# Patient Record
Sex: Female | Born: 1980 | Race: Black or African American | Hispanic: No | Marital: Single | Smoking: Never smoker
Health system: Southern US, Community
[De-identification: ages and names within clinical notes are randomized; demographics above are authoritative.]

## PROBLEM LIST (undated history)

## (undated) HISTORY — PX: TUBAL LIGATION: SHX77

---

## 2016-05-16 ENCOUNTER — Emergency Department (HOSPITAL_BASED_OUTPATIENT_CLINIC_OR_DEPARTMENT_OTHER): Payer: 59

## 2016-05-16 ENCOUNTER — Encounter (HOSPITAL_BASED_OUTPATIENT_CLINIC_OR_DEPARTMENT_OTHER): Payer: Self-pay | Admitting: Emergency Medicine

## 2016-05-16 ENCOUNTER — Emergency Department (HOSPITAL_BASED_OUTPATIENT_CLINIC_OR_DEPARTMENT_OTHER)
Admission: EM | Admit: 2016-05-16 | Discharge: 2016-05-16 | Disposition: A | Discharge status: Home / Self Care | Payer: 59 | Attending: Emergency Medicine | Admitting: Emergency Medicine

## 2016-05-16 DIAGNOSIS — R0789 Other chest pain: Secondary | ICD-10-CM | POA: Insufficient documentation

## 2016-05-16 DIAGNOSIS — R42 Dizziness and giddiness: Secondary | ICD-10-CM | POA: Diagnosis not present

## 2016-05-16 DIAGNOSIS — R079 Chest pain, unspecified: Secondary | ICD-10-CM

## 2016-05-16 LAB — URINALYSIS, ROUTINE W REFLEX MICROSCOPIC
Bilirubin Urine: NEGATIVE
GLUCOSE, UA: NEGATIVE mg/dL
Hgb urine dipstick: NEGATIVE
KETONES UR: NEGATIVE mg/dL
LEUKOCYTES UA: NEGATIVE
Nitrite: NEGATIVE
PH: 7 (ref 5.0–8.0)
Protein, ur: NEGATIVE mg/dL
Specific Gravity, Urine: 1.021 (ref 1.005–1.030)

## 2016-05-16 LAB — CBC WITH DIFFERENTIAL/PLATELET
Basophils Absolute: 0 10*3/uL (ref 0.0–0.1)
Basophils Relative: 0 %
Eosinophils Absolute: 0.3 10*3/uL (ref 0.0–0.7)
Eosinophils Relative: 3 %
HCT: 39 % (ref 36.0–46.0)
Hemoglobin: 12.9 g/dL (ref 12.0–15.0)
Lymphocytes Relative: 49 %
Lymphs Abs: 3.9 10*3/uL (ref 0.7–4.0)
MCH: 28.8 pg (ref 26.0–34.0)
MCHC: 33.1 g/dL (ref 30.0–36.0)
MCV: 87.1 fL (ref 78.0–100.0)
Monocytes Absolute: 0.7 10*3/uL (ref 0.1–1.0)
Monocytes Relative: 9 %
Neutro Abs: 3.2 10*3/uL (ref 1.7–7.7)
Neutrophils Relative %: 39 %
Platelets: 390 10*3/uL (ref 150–400)
RBC: 4.48 MIL/uL (ref 3.87–5.11)
RDW: 13.2 % (ref 11.5–15.5)
WBC: 8.1 10*3/uL (ref 4.0–10.5)

## 2016-05-16 LAB — BASIC METABOLIC PANEL
Anion gap: 7 (ref 5–15)
BUN: 11 mg/dL (ref 6–20)
CO2: 28 mmol/L (ref 22–32)
Calcium: 9.4 mg/dL (ref 8.9–10.3)
Chloride: 102 mmol/L (ref 101–111)
Creatinine, Ser: 0.81 mg/dL (ref 0.44–1.00)
GFR calc Af Amer: >60 mL/min (ref >60)
GFR calc non Af Amer: >60 mL/min (ref >60)
Glucose, Bld: 95 mg/dL (ref 65–99)
Potassium: 3.8 mmol/L (ref 3.5–5.1)
Sodium: 137 mmol/L (ref 135–145)

## 2016-05-16 LAB — D-DIMER, QUANTITATIVE: D-Dimer, Quant: <0.27 ug/mL-FEU (ref 0.00–0.50)

## 2016-05-16 LAB — TROPONIN I: Troponin I: <0.03 ng/mL (ref <0.03)

## 2016-05-16 LAB — PREGNANCY, URINE: Preg Test, Ur: NEGATIVE

## 2016-05-16 MED ORDER — IBUPROFEN 800 MG PO TABS: 800.0000 mg | ORAL_TABLET | Freq: Three times a day (TID) | ORAL | 0 refills | Status: DC | PRN

## 2016-05-16 NOTE — Discharge Instructions (Signed)
Your testing here tonight did not show any signs of heart damage or blood clot in your lungs.  Follow-up with your primary care doctor.

## 2016-05-16 NOTE — ED Triage Notes (Signed)
Patient states that she had a Tuba ligation about 1 weeks ago. Since she has had SOB and chest pain

## 2016-05-16 NOTE — ED Provider Notes (Signed)
[facility]   CSN: [id number] Arrival date & time: 05/16/16  1703  By signing my name below, I, Melissa Hanson, attest that this documentation has been prepared under the direction and in the presence of Eli Lilly and CompanyChristopher Dakiya Puopolo, PA-C. Electronically Signed: Phillis HaggisGabriella Hanson, ED Scribe. 05/16/16. 6:24 PM.  History   Chief Complaint Chief Complaint  Patient presents with  . Chest Pain   The history is provided by the patient. No language interpreter was used.   HPI Comments: Melissa Hanson is a 35 y.o. female who presents to the Emergency Department complaining of intermittent, tight, central chest pain onset one week ago. She reports associated lightheadedness, and dizziness. She states that her pain began after having tubal ligation surgery. Pt does not note any complications post-surgery. She has not taken anything for her pain. She denies fever, cough, SOB, leg swelling, or abdominal pain.   History reviewed. No pertinent past medical history.  There are no active problems to display for this patient.   Past Surgical History:  Procedure Laterality Date  . TUBAL LIGATION      OB History    No data available       Home Medications    Prior to Admission medications   Not on File    Family History History reviewed. No pertinent family history.  Social History Social History  Substance Use Topics  . Smoking status: Never Smoker  . Smokeless tobacco: Never Used  . Alcohol use No     Allergies   Patient has no known allergies.   Review of Systems Review of Systems  Constitutional: Negative for fever.  Respiratory: Negative for cough and shortness of breath.   Cardiovascular: Positive for chest pain. Negative for leg swelling.  Gastrointestinal: Negative for abdominal pain.  Neurological: Positive for dizziness and light-headedness.  All other systems reviewed and are negative.  Physical Exam Updated Vital Signs BP 144/83 (BP  Location: Right Arm)   Pulse 91   Temp 98.3 F (36.8 C) (Oral)   Resp 20   Ht 5\' 5"  (1.651 m)   Wt 206 lb (93.4 kg)   LMP 05/09/2016   SpO2 100%   BMI 34.28 kg/m   Physical Exam  Constitutional: She is oriented to person, place, and time. She appears well-developed and well-nourished.  HENT:  Head: Normocephalic and atraumatic.  Eyes: Conjunctivae are normal.  Neck: Normal range of motion. Neck supple.  Cardiovascular: Normal rate, regular rhythm and normal heart sounds.  Exam reveals no gallop and no friction rub.   No murmur heard. Pulmonary/Chest: Effort normal and breath sounds normal. She has no wheezes.  Abdominal: Soft. There is no tenderness.  Musculoskeletal: Normal range of motion.  Neurological: She is alert and oriented to person, place, and time.  Skin: Skin is warm and dry.  Psychiatric: She has a normal mood and affect. Her behavior is normal.  Nursing note and vitals reviewed.   ED Treatments / Results  DIAGNOSTIC STUDIES: Oxygen Saturation is 100% on RA, normal by my interpretation.    COORDINATION OF CARE: 6:23 PM-Discussed treatment plan which includes labs, EKG, and x-ray with pt at bedside and pt agreed to plan.    Labs (all labs ordered are listed, but only abnormal results are displayed) Labs Reviewed  PREGNANCY, URINE  URINALYSIS, ROUTINE W REFLEX MICROSCOPIC (NOT [facility])  BASIC METABOLIC PANEL  CBC WITH DIFFERENTIAL/PLATELET  TROPONIN I  D-DIMER, QUANTITATIVE (NOT AT [facility])    EKG  EKG Interpretation  Date/Time:  Monday May 16 2016 17:11:18 EST Ventricular Rate:  93 PR Interval:  142 QRS Duration: 80 QT Interval:  330 QTC Calculation: 410 R Axis:   46 Text Interpretation:  Normal sinus rhythm Nonspecific T wave abnormality Abnormal ECG No old tracing to compare Confirmed by BELFI  MD, MELANIE ([id number]) on 05/16/2016 5:17:43 PM       Radiology Dg Chest 2 View  Result Date: 05/16/2016 CLINICAL DATA:  Chest pain EXAM: CHEST   2 VIEW COMPARISON:  None. FINDINGS: Normal heart size. Normal mediastinal contour. No pneumothorax. No pleural effusion. Lungs appear clear, with no acute consolidative airspace disease and no pulmonary edema. IMPRESSION: No active cardiopulmonary disease. Electronically Signed   By: Delbert PhenixJason A Poff M.D.   On: 05/16/2016 19:05    Procedures Procedures (including critical care time)  Medications Ordered in ED Medications - No data to display   Initial Impression / Assessment and Plan / ED Course  I have reviewed the triage vital signs and the nursing notes.  Pertinent labs & imaging results that were available during my care of the patient were reviewed by me and considered in my medical decision making (see chart for details).  Clinical Course     Seems atypical for cardiac chest pain.  Patient did have a tubal ligation one week ago.  She has a negative d-dimer and other than the recent surgical procedure has had no other risk factor.  She has not been immobile and states that she has been going about her normal activities Final Clinical Impressions(s) / ED Diagnoses   Final diagnoses:  None  I personally performed the services described in this documentation, which was scribed in my presence. The recorded information has been reviewed and is accurate.  New Prescriptions New Prescriptions   No medications on file     Charlestine NightChristopher Abbey Veith, PA-C 05/20/16 2104    Rolan BuccoMelanie Belfi, MD 05/20/16 2320

## 2016-05-16 NOTE — ED Notes (Signed)
Patient transported to X-ray 

## 2016-05-16 NOTE — ED Notes (Signed)
ED Provider at bedside. 

## 2016-05-16 NOTE — ED Notes (Signed)
ED Provider at bedside discussing test results and dispo plan of care. 

## 2016-06-16 ENCOUNTER — Emergency Department (HOSPITAL_BASED_OUTPATIENT_CLINIC_OR_DEPARTMENT_OTHER)
Admission: EM | Admit: 2016-06-16 | Discharge: 2016-06-16 | Disposition: A | Discharge status: Home / Self Care | Payer: 59 | Attending: Emergency Medicine | Admitting: Emergency Medicine

## 2016-06-16 ENCOUNTER — Encounter (HOSPITAL_BASED_OUTPATIENT_CLINIC_OR_DEPARTMENT_OTHER): Payer: Self-pay

## 2016-06-16 ENCOUNTER — Emergency Department (HOSPITAL_BASED_OUTPATIENT_CLINIC_OR_DEPARTMENT_OTHER): Payer: 59

## 2016-06-16 DIAGNOSIS — Y9389 Activity, other specified: Secondary | ICD-10-CM | POA: Insufficient documentation

## 2016-06-16 DIAGNOSIS — R51 Headache: Secondary | ICD-10-CM | POA: Diagnosis not present

## 2016-06-16 DIAGNOSIS — Y999 Unspecified external cause status: Secondary | ICD-10-CM | POA: Diagnosis not present

## 2016-06-16 DIAGNOSIS — M542 Cervicalgia: Secondary | ICD-10-CM | POA: Insufficient documentation

## 2016-06-16 DIAGNOSIS — Y92410 Unspecified street and highway as the place of occurrence of the external cause: Secondary | ICD-10-CM | POA: Insufficient documentation

## 2016-06-16 DIAGNOSIS — R1032 Left lower quadrant pain: Secondary | ICD-10-CM | POA: Diagnosis not present

## 2016-06-16 DIAGNOSIS — S199XXA Unspecified injury of neck, initial encounter: Secondary | ICD-10-CM | POA: Diagnosis present

## 2016-06-16 MED ORDER — ACETAMINOPHEN 325 MG PO TABS
650.0000 mg | ORAL_TABLET | Freq: Once | ORAL | Status: AC
  Administered 2016-06-16: 650 mg via ORAL
  Filled 2016-06-16: qty 2

## 2016-06-16 MED ORDER — NAPROXEN 375 MG PO TABS: 375.0000 mg | ORAL_TABLET | Freq: Two times a day (BID) | ORAL | 0 refills | Status: AC
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
## 2016-06-16 NOTE — ED Notes (Signed)
Pt denies being pregnant, no skin discoloration, bruising or seat belt marks noted

## 2016-06-16 NOTE — ED Notes (Signed)
PERRLA.
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
## 2016-06-16 NOTE — ED Notes (Signed)
C/o abdominal pain, abd assessment WNL
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
## 2016-06-16 NOTE — ED Notes (Signed)
C-Collar removed by PA-C

## 2016-06-16 NOTE — ED Notes (Signed)
ED Provider at bedside. 

## 2016-06-30 ENCOUNTER — Emergency Department (HOSPITAL_BASED_OUTPATIENT_CLINIC_OR_DEPARTMENT_OTHER)
Admission: EM | Admit: 2016-06-30 | Discharge: 2016-06-30 | Disposition: A | Discharge status: Home / Self Care | Payer: 59 | Attending: Emergency Medicine | Admitting: Emergency Medicine

## 2016-06-30 ENCOUNTER — Encounter (HOSPITAL_BASED_OUTPATIENT_CLINIC_OR_DEPARTMENT_OTHER): Payer: Self-pay | Admitting: *Deleted

## 2016-06-30 DIAGNOSIS — M25511 Pain in right shoulder: Secondary | ICD-10-CM | POA: Insufficient documentation

## 2016-06-30 DIAGNOSIS — R51 Headache: Secondary | ICD-10-CM | POA: Insufficient documentation

## 2016-06-30 DIAGNOSIS — Y9389 Activity, other specified: Secondary | ICD-10-CM | POA: Insufficient documentation

## 2016-06-30 DIAGNOSIS — M542 Cervicalgia: Secondary | ICD-10-CM | POA: Insufficient documentation

## 2016-06-30 DIAGNOSIS — M25551 Pain in right hip: Secondary | ICD-10-CM | POA: Insufficient documentation

## 2016-06-30 DIAGNOSIS — R200 Anesthesia of skin: Secondary | ICD-10-CM | POA: Insufficient documentation

## 2016-06-30 DIAGNOSIS — Y92410 Unspecified street and highway as the place of occurrence of the external cause: Secondary | ICD-10-CM | POA: Insufficient documentation

## 2016-06-30 DIAGNOSIS — Y999 Unspecified external cause status: Secondary | ICD-10-CM | POA: Insufficient documentation

## 2016-06-30 MED ORDER — CYCLOBENZAPRINE HCL 10 MG PO TABS: 10.0000 mg | ORAL_TABLET | Freq: Three times a day (TID) | ORAL | 0 refills | Status: AC | PRN

## 2016-06-30 MED ORDER — MELOXICAM 15 MG PO TABS: 15.0000 mg | ORAL_TABLET | Freq: Every day | ORAL | 0 refills | Status: AC | PRN

## 2016-06-30 MED FILL — MELOXICAM 15 MG TABLET: 15 | 10 days supply | Qty: 10 | Fill #0

## 2016-06-30 MED FILL — CYCLOBENZAPRINE 10 MG TAB: 10 | 4 days supply | Qty: 12 | Fill #0
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
## 2017-04-22 ENCOUNTER — Encounter (HOSPITAL_BASED_OUTPATIENT_CLINIC_OR_DEPARTMENT_OTHER): Payer: Self-pay | Admitting: Emergency Medicine

## 2017-04-22 ENCOUNTER — Emergency Department (HOSPITAL_BASED_OUTPATIENT_CLINIC_OR_DEPARTMENT_OTHER)
Admission: EM | Admit: 2017-04-22 | Discharge: 2017-04-23 | Disposition: A | Discharge status: Home / Self Care | Payer: 59 | Attending: Emergency Medicine | Admitting: Emergency Medicine

## 2017-04-22 DIAGNOSIS — Z79899 Other long term (current) drug therapy: Secondary | ICD-10-CM | POA: Insufficient documentation

## 2017-04-22 DIAGNOSIS — B9789 Other viral agents as the cause of diseases classified elsewhere: Secondary | ICD-10-CM | POA: Insufficient documentation

## 2017-04-22 DIAGNOSIS — J069 Acute upper respiratory infection, unspecified: Secondary | ICD-10-CM | POA: Insufficient documentation

## 2017-04-22 LAB — RAPID STREP SCREEN (MED CTR MEBANE ONLY): STREPTOCOCCUS, GROUP A SCREEN (DIRECT): NEGATIVE

## 2017-04-22 MED ORDER — ACETAMINOPHEN 325 MG PO TABS
650.0000 mg | ORAL_TABLET | Freq: Once | ORAL | Status: AC
  Administered 2017-04-22: 650 mg via ORAL
  Filled 2017-04-22: qty 2

## 2017-04-22 NOTE — ED Triage Notes (Addendum)
PT presents with c/o flu like symptoms since Wednesday.  PT reports she has been trying herbal stuff at home but no OTC.

## 2017-04-22 NOTE — ED Notes (Signed)
ED Provider at bedside. 

## 2017-04-23 MED ORDER — DEXAMETHASONE SODIUM PHOSPHATE 10 MG/ML IJ SOLN
10.0000 mg | Freq: Once | INTRAMUSCULAR | Status: AC
  Administered 2017-04-23: 10 mg via INTRAVENOUS
  Filled 2017-04-23: qty 1

## 2017-04-23 MED ORDER — SODIUM CHLORIDE 0.9 % IV BOLUS (SEPSIS)
1000.0000 mL | Freq: Once | INTRAVENOUS | Status: AC
  Administered 2017-04-23: 1000 mL via INTRAVENOUS

## 2017-04-23 MED ORDER — MAGIC MOUTHWASH
ORAL | Status: AC
  Filled 2017-04-23: qty 10

## 2017-04-23 MED ORDER — MAGIC MOUTHWASH W/LIDOCAINE: 5.0000 mL | Freq: Four times a day (QID) | ORAL | 0 refills | Status: AC | PRN

## 2017-04-23 MED ORDER — NAPROXEN 250 MG PO TABS
500.0000 mg | ORAL_TABLET | Freq: Once | ORAL | Status: AC
  Administered 2017-04-23: 500 mg via ORAL
  Filled 2017-04-23: qty 2

## 2017-04-23 MED ORDER — MAGIC MOUTHWASH
5.0000 mL | Freq: Once | ORAL | Status: AC
  Administered 2017-04-23: 5 mL via ORAL

## 2017-04-23 MED ORDER — IBUPROFEN 600 MG PO TABS: 600.0000 mg | ORAL_TABLET | Freq: Four times a day (QID) | ORAL | 0 refills | Status: AC | PRN

## 2017-04-23 NOTE — ED Notes (Signed)
Alert, NAD, calm, interactive, resps e/u, speaking in complete sentences, voice hoarse, no dyspnea noted, skin W&D, VSS/ improved, (denies: sob, nausea, dizziness). [facility]

## 2017-04-23 NOTE — ED Provider Notes (Signed)
[facility] EMERGENCY DEPARTMENT Provider Note   CSN: [id number] Arrival date & time: 04/22/17  2307     History   Chief Complaint Chief Complaint  Patient presents with  . Nasal Congestion    HPI Melissa Hanson is a 36 y.o. female.  HPI Patient with no sig medical history comes in with chief complaint of cough, congestion, chills, malaise,, headaches.  Patient reports that she started getting sick about 4 days ago.  Patient started with a sore throat that gradually progressed to other symptoms.  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  Patient has not taken a flu shot this year.  The cough is not productive of any phlegm.  Patient is having pleuritic type chest pain with the cough but she denies any shortness of breath, wheezing.  Patient has no history of lung disease.  Patient also reports that due to her sore throat she is having difficulty swallowing due to pain.  History reviewed. No pertinent past medical history.  There are no active problems to display for this patient.   Past Surgical History:  Procedure Laterality Date  . TUBAL LIGATION      OB History    No data available       Home Medications    Prior to Admission medications   Medication Sig Start Date End Date Taking? Authorizing Provider  cyclobenzaprine (FLEXERIL) 10 MG tablet Take 1 tablet (10 mg total) by mouth 3 (three) times daily as needed for muscle spasms. 06/30/16   Raeford Razor, MD  ibuprofen (ADVIL,MOTRIN) 600 MG tablet Take 1 tablet (600 mg total) by mouth every 6 (six) hours as needed. 04/23/17   Derwood Kaplan, MD  magic mouthwash w/lidocaine SOLN Take 5 mLs by mouth 4 (four) times daily as needed for mouth pain. 04/23/17   Derwood Kaplan, MD  meloxicam (MOBIC) 15 MG tablet Take 1 tablet (15 mg total) by mouth daily as needed for pain. 06/30/16   Raeford Razor, MD  naproxen (NAPROSYN) 375 MG tablet Take 1 tablet (375 mg total) by mouth 2 (two)  times daily. 06/16/16   Felicie Morn, NP    Family History No family history on file.  Social History Social History  Substance Use Topics  . Smoking status: Never Smoker  . Smokeless tobacco: Never Used  . Alcohol use No     Allergies   Patient has no known allergies.   Review of Systems Review of Systems  Constitutional: Positive for activity change.  HENT: Positive for congestion, postnasal drip, rhinorrhea, sore throat and voice change. Negative for ear discharge, ear pain, facial swelling, hearing loss and trouble swallowing.   Respiratory: Negative for shortness of breath.   Cardiovascular: Positive for chest pain.  Skin: Negative for rash.  Allergic/Immunologic: Negative for immunocompromised state.     Physical Exam Updated Vital Signs BP (!) 141/92 (BP Location: Left Arm)   Pulse (!) 120   Temp (!) 101.5 F (38.6 C) (Oral)   Resp 18   LMP 04/08/2017   SpO2 100%   Physical Exam  Constitutional: She is oriented to person, place, and time. She appears well-developed.  HENT:  Head: Normocephalic and atraumatic.  Mouth/Throat: No oropharyngeal exudate.  L sided tonsillar enlargement  Eyes: EOM are normal.  Neck: Normal range of motion. Neck supple.  No nuchal rigidity  Cardiovascular: Normal rate.   Pulmonary/Chest: Effort normal.  Abdominal: Bowel sounds are normal.  Lymphadenopathy:    She has  cervical adenopathy.  Neurological: She is alert and oriented to person, place, and time.  Skin: Skin is warm and dry.  Nursing note and vitals reviewed.    ED Treatments / Results  Labs (all labs ordered are listed, but only abnormal results are displayed) Labs Reviewed  [facility])  CULTURE, GROUP A STREP [facility])    EKG  EKG Interpretation None       Radiology No results found.  Procedures Procedures (including critical care time)  Medications Ordered in ED Medications  acetaminophen (TYLENOL) tablet 650 mg (650 mg  Oral Given 04/22/17 2355)  magic mouthwash (5 mLs Oral Given 04/23/17 0027)  naproxen (NAPROSYN) tablet 500 mg (500 mg Oral Given 04/23/17 0027)  sodium chloride 0.9 % bolus 1,000 mL (1,000 mLs Intravenous New Bag/Given 04/23/17 0027)  dexamethasone (DECADRON) injection 10 mg (10 mg Intravenous Given 04/23/17 0027)     Initial Impression / Assessment and Plan / ED Course  I have reviewed the triage vital signs and the nursing notes.  Pertinent labs & imaging results that were available during my care of the patient were reviewed by me and considered in my medical decision making (see chart for details).    Patient comes in with appears to be URI /viral syndrome. Patient had a fever, tachycardia here.  Patient's lung exam is clear and she has upper respiratory infection-like symptoms along with sore throat.  No nuchal rigidity.  Patient is noted to be tachycardic, which appears to be likely due to dehydration as patient admits to not taking appropriate p.o. intake because of the sore throat. We will give patient some IV hydration, give Decadron and pain meds. Rapid strep has been ordered by triage and is negative. No indication for chest x-ray. Patient advised to hydrate well, take medications around the clock for pain and fever. Strict ER return precautions have been discussed, and patient is agreeing with the plan and is comfortable with the workup done and the recommendations from the ER.   Final Clinical Impressions(s) / ED Diagnoses   Final diagnoses:  Viral URI with cough    New Prescriptions New Prescriptions   IBUPROFEN (ADVIL,MOTRIN) 600 MG TABLET    Take 1 tablet (600 mg total) by mouth every 6 (six) hours as needed.   MAGIC MOUTHWASH W/LIDOCAINE SOLN    Take 5 mLs by mouth 4 (four) times daily as needed for mouth pain.     Derwood KaplanNanavati, Aveya Beal, MD 04/23/17 [id number]

## 2017-04-23 NOTE — Discharge Instructions (Signed)
°  We think what you have is a viral syndrome - the treatment for which is symptomatic relief only, and your body will fight the infection off in a few days. °We are prescribing you some meds for pain and fevers. °See your primary care doctor in 1 week if the symptoms dont improve. °

## 2017-04-26 LAB — CULTURE, GROUP A STREP (THRC)

## 2017-07-10 IMAGING — CR DG CHEST 2V
2 series · 2 of 2 positions shown · non-contrast
Comparison: None.

CLINICAL DATA: Chest pain

EXAM:
CHEST  2 VIEW

[w chest pa]
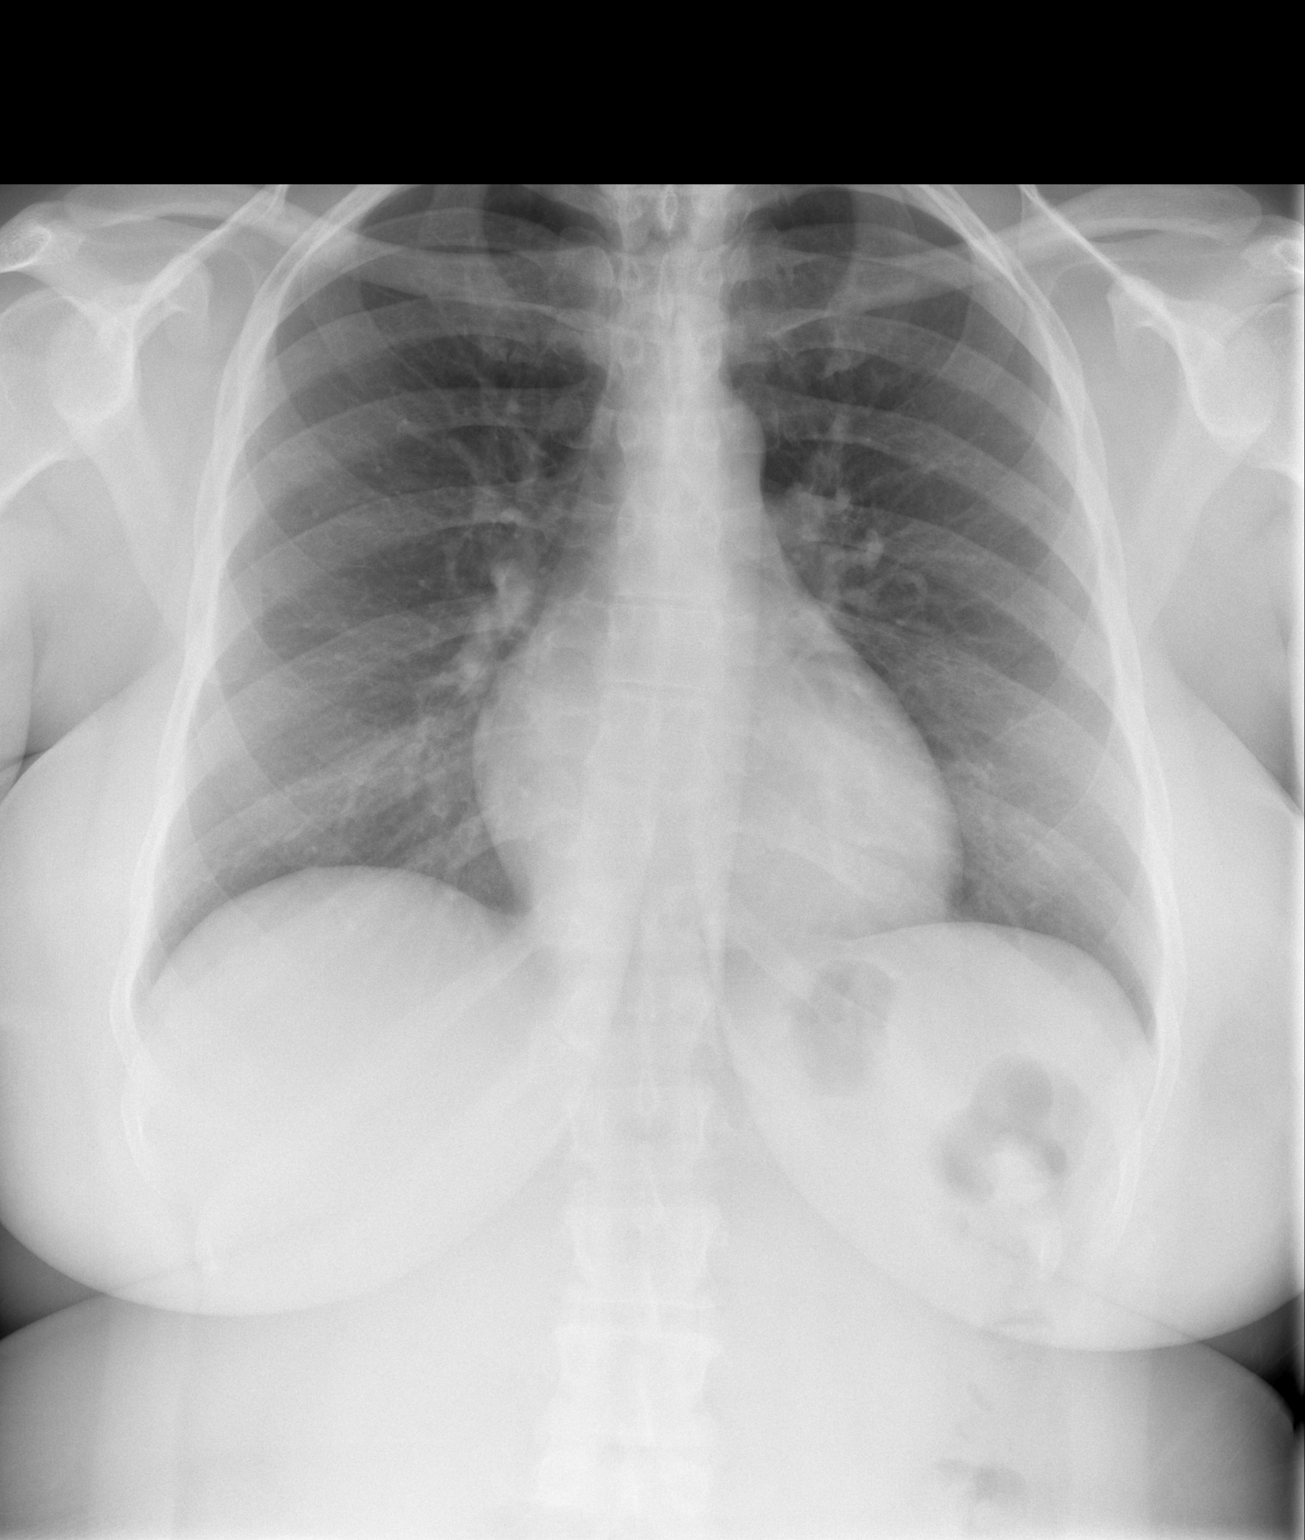

[w chest lat]
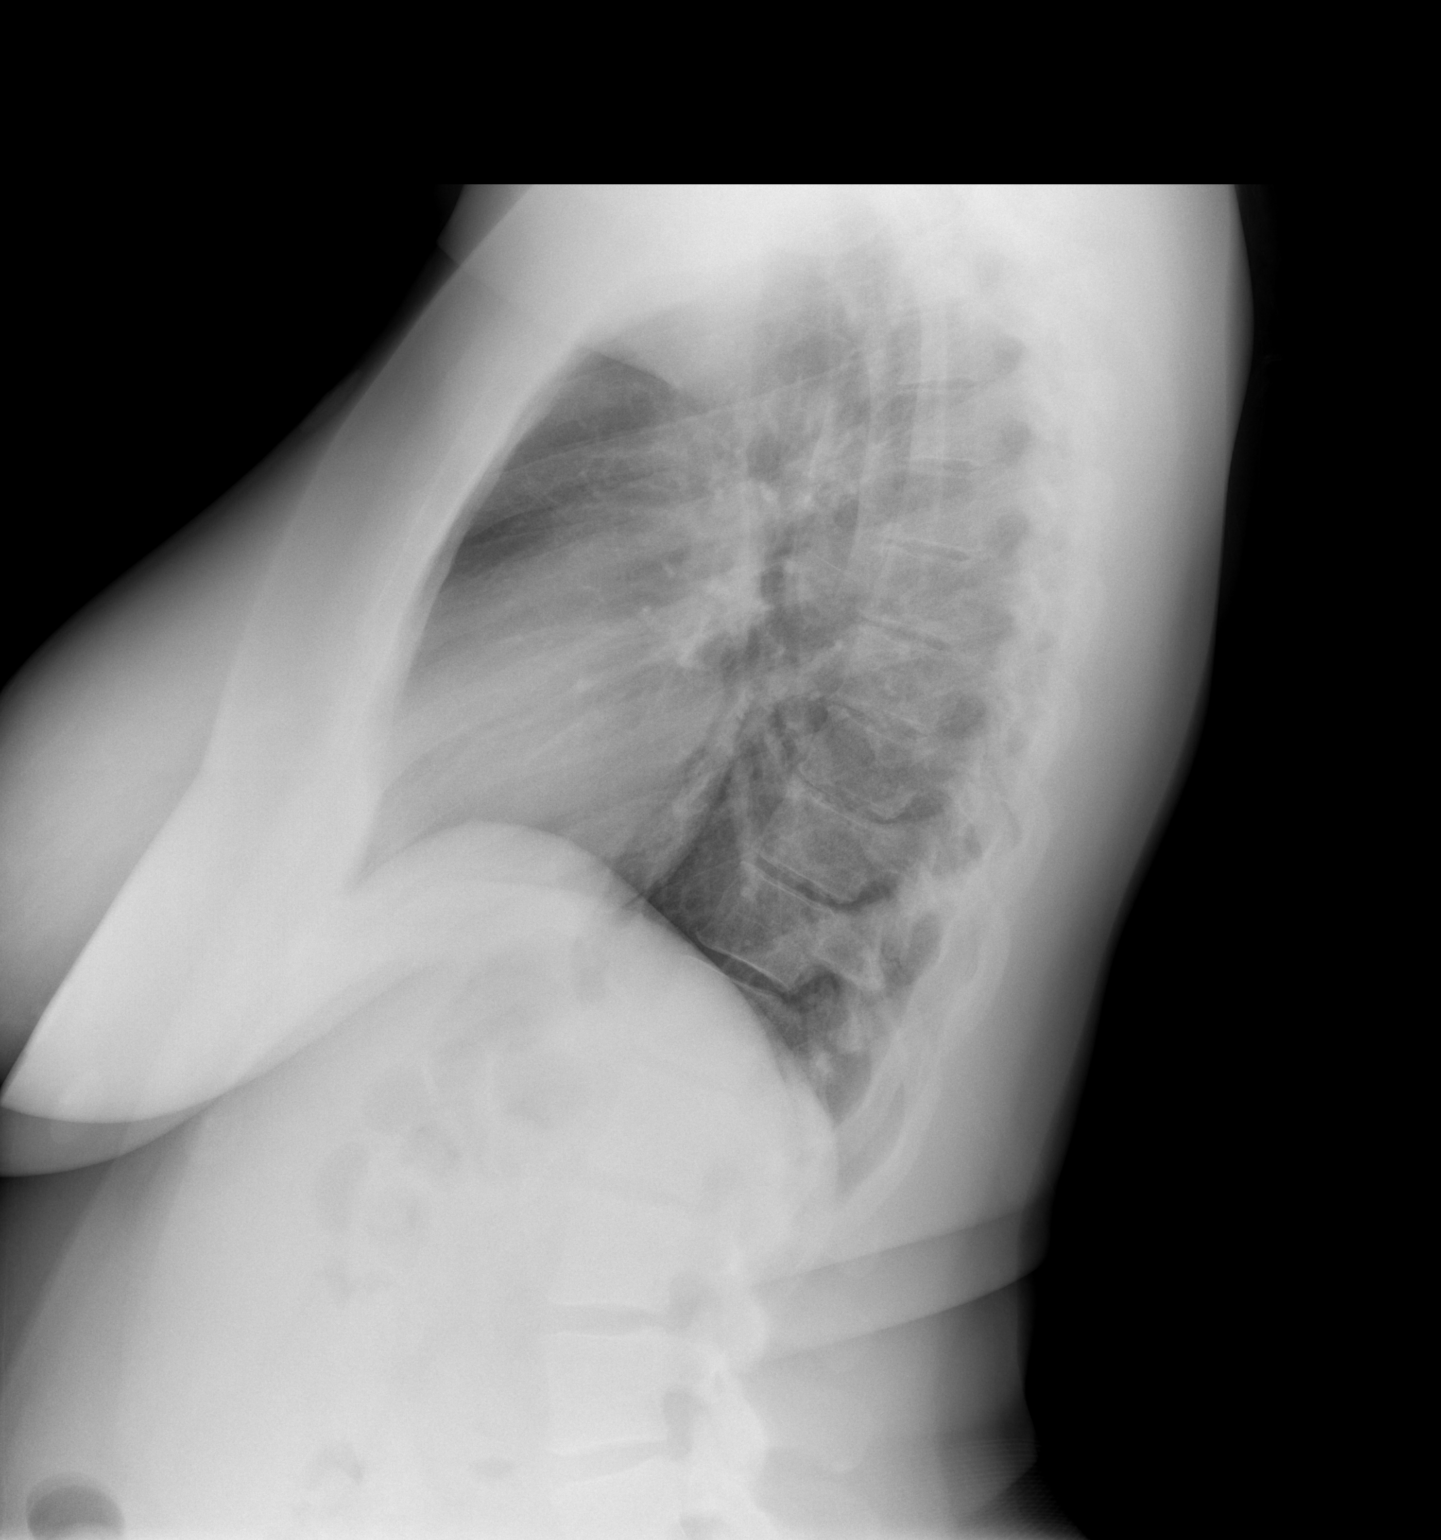

[2 of 2 positions shown; findings below may reference images not displayed]

FINDINGS: Normal heart size. Normal mediastinal contour. No pneumothorax. No
pleural effusion. Lungs appear clear, with no acute consolidative
airspace disease and no pulmonary edema.
IMPRESSION: No active cardiopulmonary disease.
